# Patient Record
Sex: Female | Born: 1935 | Race: White | Hispanic: No
Health system: Southern US, Community
[De-identification: ages and names within clinical notes are randomized; demographics above are authoritative.]

---

## 2011-06-14 DIAGNOSIS — Z87898 Personal history of other specified conditions: Secondary | ICD-10-CM | POA: Diagnosis not present

## 2011-06-14 DIAGNOSIS — Z853 Personal history of malignant neoplasm of breast: Secondary | ICD-10-CM | POA: Diagnosis not present

## 2011-06-20 DIAGNOSIS — I714 Abdominal aortic aneurysm, without rupture: Secondary | ICD-10-CM | POA: Diagnosis not present

## 2011-07-22 DIAGNOSIS — G43809 Other migraine, not intractable, without status migrainosus: Secondary | ICD-10-CM | POA: Diagnosis not present

## 2011-12-17 DIAGNOSIS — Z1231 Encounter for screening mammogram for malignant neoplasm of breast: Secondary | ICD-10-CM | POA: Diagnosis not present

## 2011-12-19 DIAGNOSIS — M949 Disorder of cartilage, unspecified: Secondary | ICD-10-CM | POA: Diagnosis not present

## 2011-12-19 DIAGNOSIS — C50119 Malignant neoplasm of central portion of unspecified female breast: Secondary | ICD-10-CM | POA: Diagnosis not present

## 2011-12-19 DIAGNOSIS — M899 Disorder of bone, unspecified: Secondary | ICD-10-CM | POA: Diagnosis not present

## 2012-01-02 DIAGNOSIS — Z961 Presence of intraocular lens: Secondary | ICD-10-CM | POA: Diagnosis not present

## 2012-01-02 DIAGNOSIS — H43399 Other vitreous opacities, unspecified eye: Secondary | ICD-10-CM | POA: Diagnosis not present

## 2012-02-28 DIAGNOSIS — C50119 Malignant neoplasm of central portion of unspecified female breast: Secondary | ICD-10-CM | POA: Diagnosis not present

## 2012-02-28 DIAGNOSIS — M899 Disorder of bone, unspecified: Secondary | ICD-10-CM | POA: Diagnosis not present

## 2012-02-28 DIAGNOSIS — M949 Disorder of cartilage, unspecified: Secondary | ICD-10-CM | POA: Diagnosis not present

## 2012-06-18 DIAGNOSIS — Z09 Encounter for follow-up examination after completed treatment for conditions other than malignant neoplasm: Secondary | ICD-10-CM | POA: Diagnosis not present

## 2012-06-18 DIAGNOSIS — M899 Disorder of bone, unspecified: Secondary | ICD-10-CM | POA: Diagnosis not present

## 2012-06-18 DIAGNOSIS — Z9119 Patient's noncompliance with other medical treatment and regimen: Secondary | ICD-10-CM | POA: Diagnosis not present

## 2012-06-18 DIAGNOSIS — M949 Disorder of cartilage, unspecified: Secondary | ICD-10-CM | POA: Diagnosis not present

## 2012-06-18 DIAGNOSIS — Z853 Personal history of malignant neoplasm of breast: Secondary | ICD-10-CM | POA: Diagnosis not present

## 2012-09-16 DIAGNOSIS — G43809 Other migraine, not intractable, without status migrainosus: Secondary | ICD-10-CM | POA: Diagnosis not present

## 2012-09-25 DIAGNOSIS — L02219 Cutaneous abscess of trunk, unspecified: Secondary | ICD-10-CM | POA: Diagnosis not present

## 2012-09-25 DIAGNOSIS — R5381 Other malaise: Secondary | ICD-10-CM | POA: Diagnosis not present

## 2012-09-25 DIAGNOSIS — A938 Other specified arthropod-borne viral fevers: Secondary | ICD-10-CM | POA: Diagnosis not present

## 2012-09-25 DIAGNOSIS — L03319 Cellulitis of trunk, unspecified: Secondary | ICD-10-CM | POA: Diagnosis not present

## 2012-12-17 DIAGNOSIS — Z1231 Encounter for screening mammogram for malignant neoplasm of breast: Secondary | ICD-10-CM | POA: Diagnosis not present

## 2012-12-17 DIAGNOSIS — Z853 Personal history of malignant neoplasm of breast: Secondary | ICD-10-CM | POA: Diagnosis not present

## 2012-12-19 DIAGNOSIS — H81399 Other peripheral vertigo, unspecified ear: Secondary | ICD-10-CM | POA: Diagnosis not present

## 2012-12-24 DIAGNOSIS — Z09 Encounter for follow-up examination after completed treatment for conditions other than malignant neoplasm: Secondary | ICD-10-CM | POA: Diagnosis not present

## 2012-12-24 DIAGNOSIS — C50119 Malignant neoplasm of central portion of unspecified female breast: Secondary | ICD-10-CM | POA: Diagnosis not present

## 2012-12-24 DIAGNOSIS — M899 Disorder of bone, unspecified: Secondary | ICD-10-CM | POA: Diagnosis not present

## 2013-02-18 DIAGNOSIS — H6120 Impacted cerumen, unspecified ear: Secondary | ICD-10-CM | POA: Diagnosis not present

## 2013-02-18 DIAGNOSIS — Z136 Encounter for screening for cardiovascular disorders: Secondary | ICD-10-CM | POA: Diagnosis not present

## 2013-02-18 DIAGNOSIS — Z0000 Encounter for general adult medical examination without abnormal findings: Secondary | ICD-10-CM | POA: Diagnosis not present

## 2013-03-09 DIAGNOSIS — H5200 Hypermetropia, unspecified eye: Secondary | ICD-10-CM | POA: Diagnosis not present

## 2013-03-09 DIAGNOSIS — H52229 Regular astigmatism, unspecified eye: Secondary | ICD-10-CM | POA: Diagnosis not present

## 2013-03-09 DIAGNOSIS — Z9849 Cataract extraction status, unspecified eye: Secondary | ICD-10-CM | POA: Diagnosis not present

## 2013-03-09 DIAGNOSIS — Z961 Presence of intraocular lens: Secondary | ICD-10-CM | POA: Diagnosis not present

## 2013-03-16 DIAGNOSIS — Z1211 Encounter for screening for malignant neoplasm of colon: Secondary | ICD-10-CM | POA: Diagnosis not present

## 2013-07-28 DIAGNOSIS — R5381 Other malaise: Secondary | ICD-10-CM | POA: Diagnosis not present

## 2013-07-28 DIAGNOSIS — I959 Hypotension, unspecified: Secondary | ICD-10-CM | POA: Diagnosis not present

## 2013-07-28 DIAGNOSIS — R5383 Other fatigue: Secondary | ICD-10-CM | POA: Diagnosis not present

## 2013-08-05 DIAGNOSIS — R5383 Other fatigue: Secondary | ICD-10-CM | POA: Diagnosis not present

## 2013-08-05 DIAGNOSIS — R5381 Other malaise: Secondary | ICD-10-CM | POA: Diagnosis not present

## 2013-08-05 DIAGNOSIS — I959 Hypotension, unspecified: Secondary | ICD-10-CM | POA: Diagnosis not present

## 2013-12-24 DIAGNOSIS — Z1231 Encounter for screening mammogram for malignant neoplasm of breast: Secondary | ICD-10-CM | POA: Diagnosis not present

## 2013-12-30 DIAGNOSIS — Z853 Personal history of malignant neoplasm of breast: Secondary | ICD-10-CM | POA: Diagnosis not present

## 2013-12-30 DIAGNOSIS — M8589 Other specified disorders of bone density and structure, multiple sites: Secondary | ICD-10-CM | POA: Diagnosis not present

## 2014-01-14 DIAGNOSIS — H6123 Impacted cerumen, bilateral: Secondary | ICD-10-CM | POA: Diagnosis not present

## 2014-03-10 DIAGNOSIS — H43313 Vitreous membranes and strands, bilateral: Secondary | ICD-10-CM | POA: Diagnosis not present

## 2014-03-10 DIAGNOSIS — H5203 Hypermetropia, bilateral: Secondary | ICD-10-CM | POA: Diagnosis not present

## 2014-03-10 DIAGNOSIS — H43813 Vitreous degeneration, bilateral: Secondary | ICD-10-CM | POA: Diagnosis not present

## 2014-03-10 DIAGNOSIS — H524 Presbyopia: Secondary | ICD-10-CM | POA: Diagnosis not present

## 2014-03-10 DIAGNOSIS — H52223 Regular astigmatism, bilateral: Secondary | ICD-10-CM | POA: Diagnosis not present

## 2014-03-10 DIAGNOSIS — H3540 Unspecified peripheral retinal degeneration: Secondary | ICD-10-CM | POA: Diagnosis not present

## 2014-05-11 DIAGNOSIS — M25522 Pain in left elbow: Secondary | ICD-10-CM | POA: Diagnosis not present

## 2014-05-12 DIAGNOSIS — S52122A Displaced fracture of head of left radius, initial encounter for closed fracture: Secondary | ICD-10-CM | POA: Diagnosis not present

## 2014-05-26 DIAGNOSIS — S52122D Displaced fracture of head of left radius, subsequent encounter for closed fracture with routine healing: Secondary | ICD-10-CM | POA: Diagnosis not present

## 2014-07-11 DIAGNOSIS — S52122D Displaced fracture of head of left radius, subsequent encounter for closed fracture with routine healing: Secondary | ICD-10-CM | POA: Diagnosis not present

## 2014-08-22 DIAGNOSIS — L568 Other specified acute skin changes due to ultraviolet radiation: Secondary | ICD-10-CM | POA: Diagnosis not present

## 2014-08-22 DIAGNOSIS — Z6826 Body mass index (BMI) 26.0-26.9, adult: Secondary | ICD-10-CM | POA: Diagnosis not present

## 2014-12-29 DIAGNOSIS — Z1231 Encounter for screening mammogram for malignant neoplasm of breast: Secondary | ICD-10-CM | POA: Diagnosis not present

## 2015-01-02 DIAGNOSIS — Z853 Personal history of malignant neoplasm of breast: Secondary | ICD-10-CM | POA: Diagnosis not present

## 2015-01-09 DIAGNOSIS — N6489 Other specified disorders of breast: Secondary | ICD-10-CM | POA: Diagnosis not present

## 2015-01-09 DIAGNOSIS — R928 Other abnormal and inconclusive findings on diagnostic imaging of breast: Secondary | ICD-10-CM | POA: Diagnosis not present

## 2015-01-13 DIAGNOSIS — N63 Unspecified lump in breast: Secondary | ICD-10-CM | POA: Diagnosis not present

## 2015-01-13 DIAGNOSIS — Z853 Personal history of malignant neoplasm of breast: Secondary | ICD-10-CM | POA: Diagnosis not present

## 2015-01-16 ENCOUNTER — Other Ambulatory Visit: Payer: Self-pay | Admitting: Oncology

## 2015-01-16 DIAGNOSIS — R922 Inconclusive mammogram: Secondary | ICD-10-CM

## 2015-01-25 ENCOUNTER — Ambulatory Visit
Admission: RE | Admit: 2015-01-25 | Discharge: 2015-01-25 | Disposition: A | Discharge status: Home / Self Care | Payer: Medicare Other | Source: Ambulatory Visit | Attending: Oncology | Admitting: Oncology

## 2015-01-25 DIAGNOSIS — R922 Inconclusive mammogram: Secondary | ICD-10-CM

## 2015-01-25 DIAGNOSIS — N6012 Diffuse cystic mastopathy of left breast: Secondary | ICD-10-CM | POA: Diagnosis not present

## 2015-01-25 DIAGNOSIS — R928 Other abnormal and inconclusive findings on diagnostic imaging of breast: Secondary | ICD-10-CM | POA: Diagnosis not present

## 2015-01-25 DIAGNOSIS — R921 Mammographic calcification found on diagnostic imaging of breast: Secondary | ICD-10-CM | POA: Diagnosis not present

## 2015-02-06 DIAGNOSIS — Z853 Personal history of malignant neoplasm of breast: Secondary | ICD-10-CM | POA: Diagnosis not present

## 2015-02-06 DIAGNOSIS — N6489 Other specified disorders of breast: Secondary | ICD-10-CM | POA: Diagnosis not present

## 2015-02-20 DIAGNOSIS — N6489 Other specified disorders of breast: Secondary | ICD-10-CM | POA: Diagnosis not present

## 2015-03-22 ENCOUNTER — Other Ambulatory Visit: Payer: Self-pay

## 2015-03-22 DIAGNOSIS — H9190 Unspecified hearing loss, unspecified ear: Secondary | ICD-10-CM | POA: Diagnosis not present

## 2015-03-22 DIAGNOSIS — N6022 Fibroadenosis of left breast: Secondary | ICD-10-CM | POA: Diagnosis not present

## 2015-03-22 DIAGNOSIS — L905 Scar conditions and fibrosis of skin: Secondary | ICD-10-CM | POA: Diagnosis not present

## 2015-03-22 DIAGNOSIS — N6012 Diffuse cystic mastopathy of left breast: Secondary | ICD-10-CM | POA: Diagnosis not present

## 2015-03-22 DIAGNOSIS — N6092 Unspecified benign mammary dysplasia of left breast: Secondary | ICD-10-CM | POA: Diagnosis not present

## 2015-03-22 DIAGNOSIS — R928 Other abnormal and inconclusive findings on diagnostic imaging of breast: Secondary | ICD-10-CM | POA: Diagnosis not present

## 2015-03-22 DIAGNOSIS — Z853 Personal history of malignant neoplasm of breast: Secondary | ICD-10-CM | POA: Diagnosis not present

## 2015-03-22 DIAGNOSIS — N6489 Other specified disorders of breast: Secondary | ICD-10-CM | POA: Diagnosis not present

## 2015-06-08 DIAGNOSIS — H43393 Other vitreous opacities, bilateral: Secondary | ICD-10-CM | POA: Diagnosis not present

## 2015-06-08 DIAGNOSIS — Z9841 Cataract extraction status, right eye: Secondary | ICD-10-CM | POA: Diagnosis not present

## 2015-06-08 DIAGNOSIS — H524 Presbyopia: Secondary | ICD-10-CM | POA: Diagnosis not present

## 2015-06-08 DIAGNOSIS — H52223 Regular astigmatism, bilateral: Secondary | ICD-10-CM | POA: Diagnosis not present

## 2015-06-08 DIAGNOSIS — H43813 Vitreous degeneration, bilateral: Secondary | ICD-10-CM | POA: Diagnosis not present

## 2015-06-08 DIAGNOSIS — Z961 Presence of intraocular lens: Secondary | ICD-10-CM | POA: Diagnosis not present

## 2015-06-08 DIAGNOSIS — Z9842 Cataract extraction status, left eye: Secondary | ICD-10-CM | POA: Diagnosis not present

## 2015-06-08 DIAGNOSIS — H35373 Puckering of macula, bilateral: Secondary | ICD-10-CM | POA: Diagnosis not present

## 2015-07-06 DIAGNOSIS — Z853 Personal history of malignant neoplasm of breast: Secondary | ICD-10-CM | POA: Diagnosis not present

## 2016-02-08 DIAGNOSIS — R928 Other abnormal and inconclusive findings on diagnostic imaging of breast: Secondary | ICD-10-CM | POA: Diagnosis not present

## 2016-02-15 DIAGNOSIS — Z853 Personal history of malignant neoplasm of breast: Secondary | ICD-10-CM | POA: Diagnosis not present

## 2016-02-15 DIAGNOSIS — L659 Nonscarring hair loss, unspecified: Secondary | ICD-10-CM | POA: Diagnosis not present

## 2017-01-21 IMAGING — MG MM BREAST BX W LOC DEV 1ST LESION IMAGE BX SPEC STEREO GUIDE*L*
4 series · 4 of 12 positions shown · non-contrast
Comparison: Previous exams.

ADDENDUM:
Pathology reveals complex sclerosing lesion/radial scar with
associated fibrocystic changes of the Left lateral breast at the 3
o'clock location with excision recommended. This was found to be
concordant by Dr. Iqtidar Asrat. Pathology results were discussed
with the patient via telephone. She reported tenderness at the
biopsy site and is doing well otherwise. Post biopsy care and
instructions were reviewed and questions were answered. She was
encouraged to call The [REDACTED] with any
additional questions and or concerns. A surgical referral is being
arranged by Dr. Martin Ricardo Venegas in [HOSPITAL][HOSPITAL]. The patient has
an appointment with Dr. Jarinah on February 06, 2015.

Pathology results reported by Fralalan Ontl RN on January 26, 2015.
CLINICAL DATA: Patient presents for stereotactic core needle biopsy
of an area of focal density in the lateral left breast.
EXAM:
LEFT BREAST STEREOTACTIC CORE NEEDLE BIOPSY

[L CC (1 of 2)]
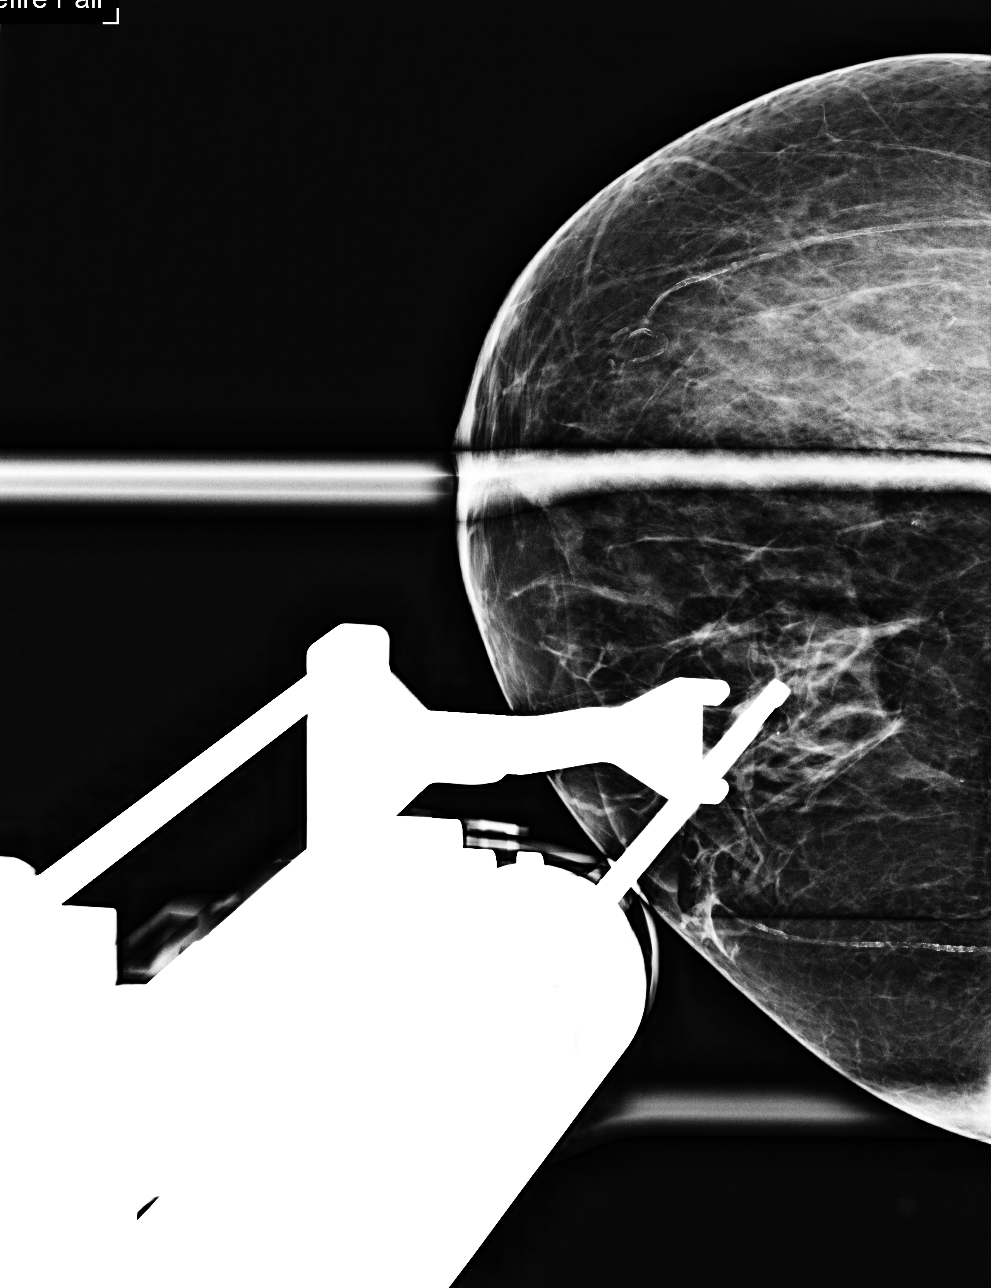

[L CC (2 of 2)]
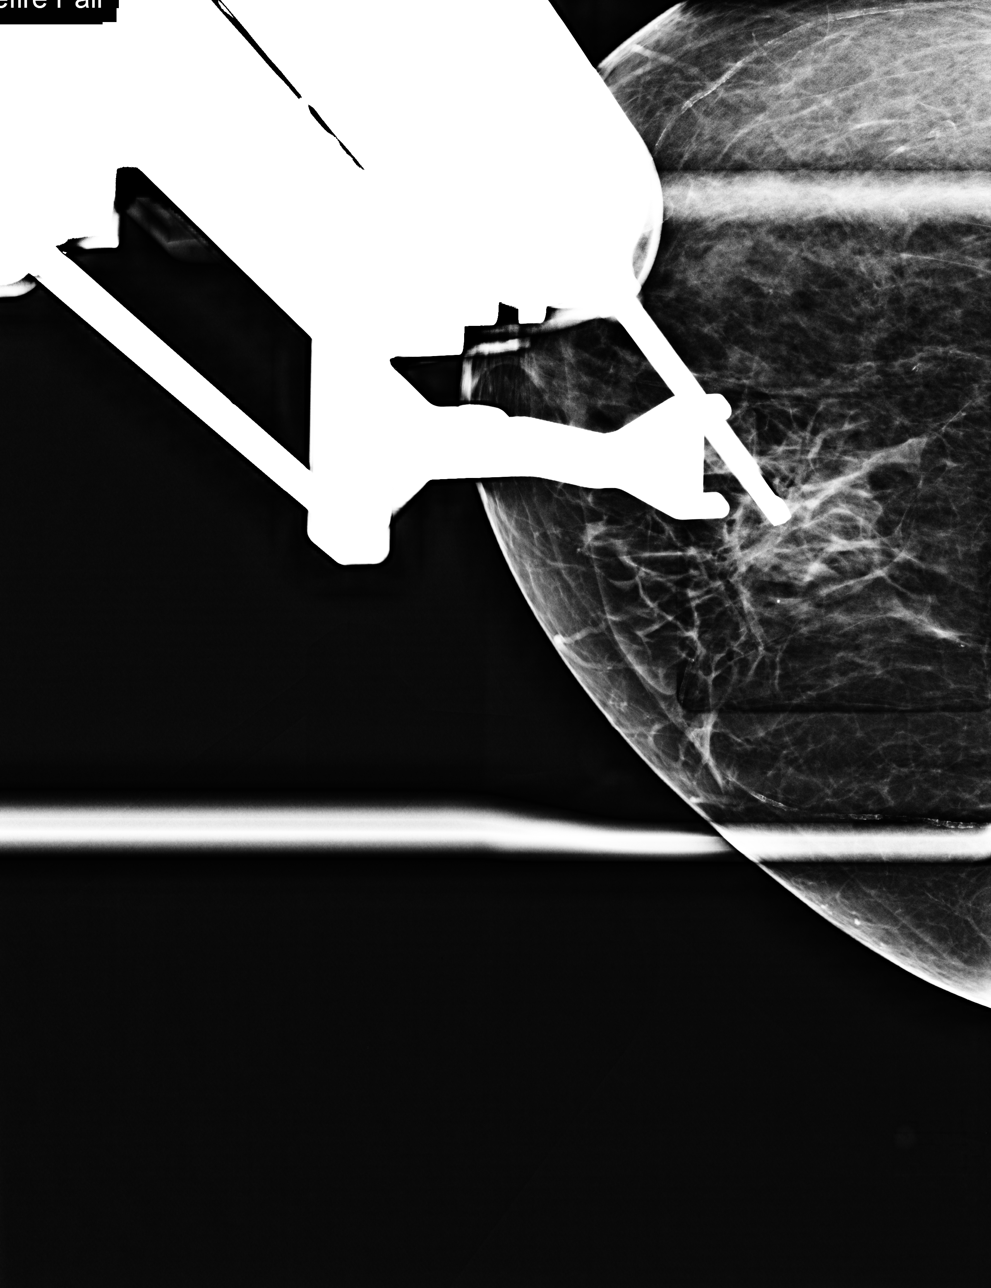

[L CC tomo (1 of 2) · tomo slice 23/44.0]
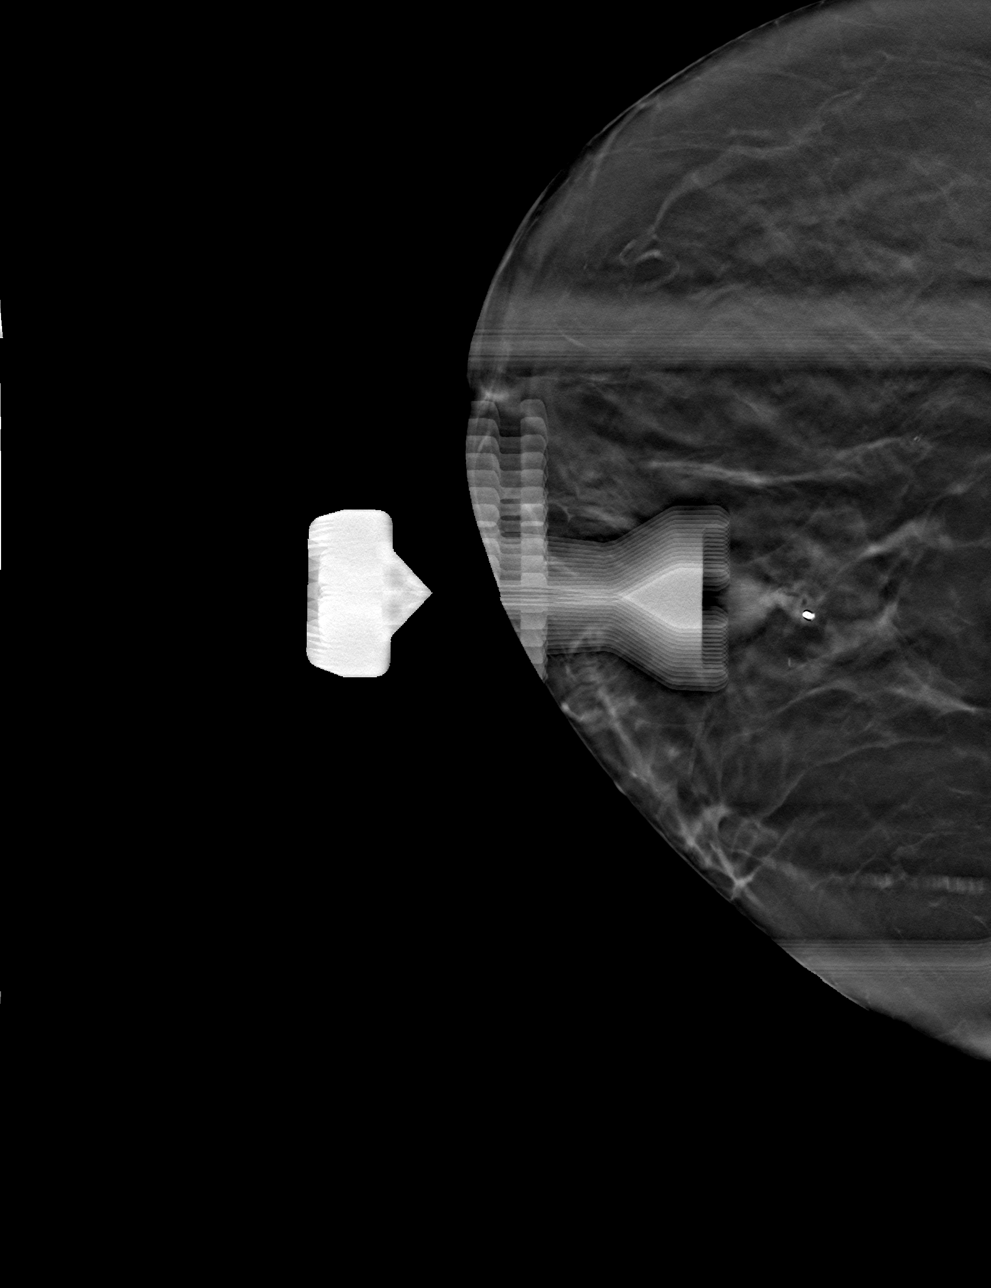

[L CC tomo (2 of 2) · tomo slice 23/45.0]
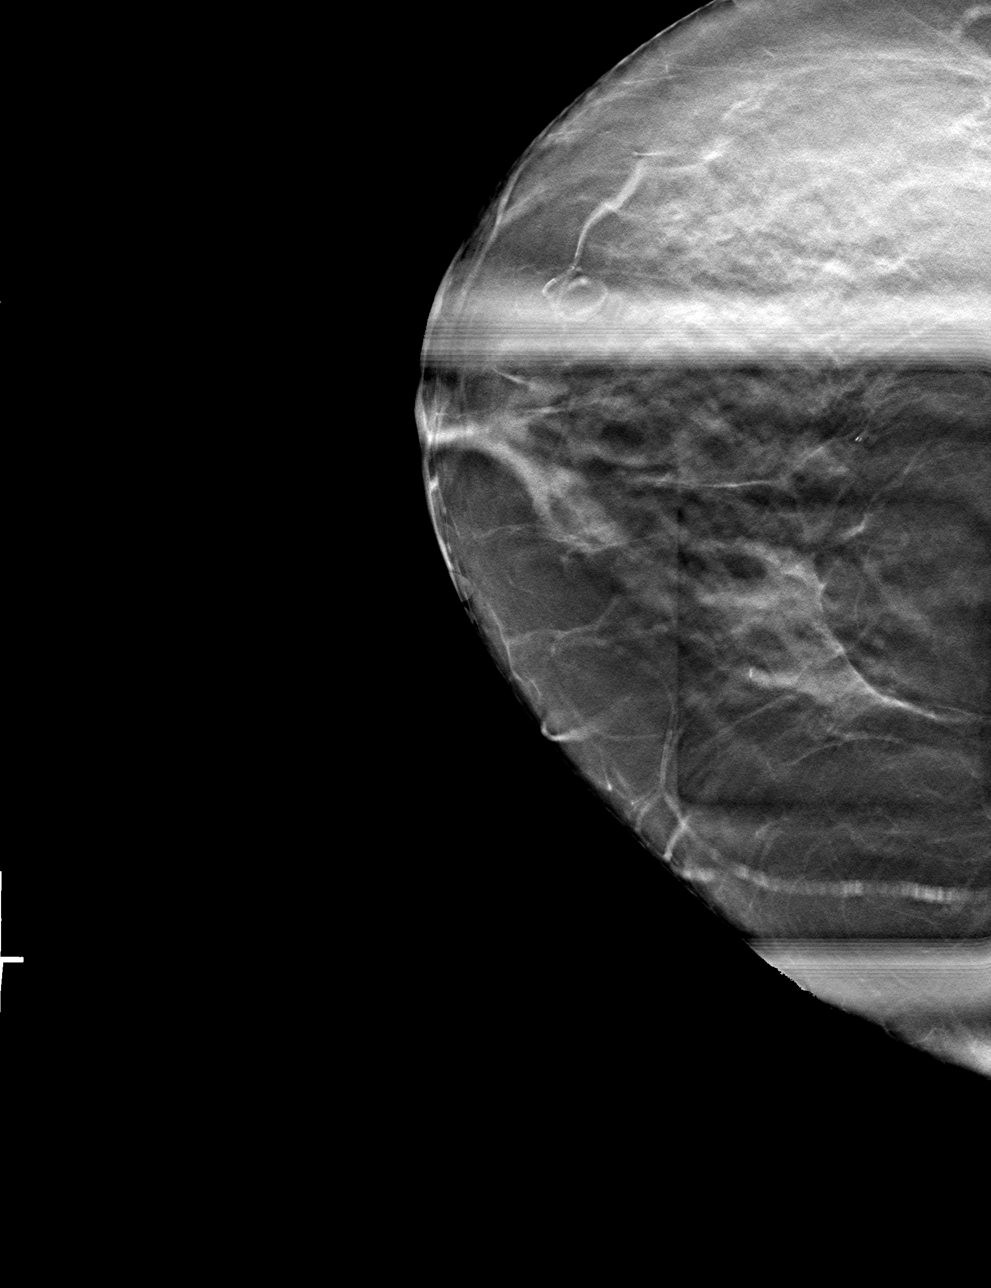

[4 of 12 positions shown; findings below may reference images not displayed]



Using sterile technique and the as local anesthetic, under
stereotactic guidance, a 9 gauge vacuum assist device was used to
perform core needle biopsy of her focal density in the lateral left
breast using a cranial to caudal approach.

At the conclusion of the procedure, a coil shaped tissue marker clip
was deployed into the biopsy cavity. Follow-up 2-view mammogram was
performed and dictated separately.
IMPRESSION: Stereotactic-guided biopsy of an area of focal opacity in the left
breast. No apparent complications.

## 2017-01-21 IMAGING — MG MM DIAG BREAST TOMO UNI LEFT
4 series · 4 of 12 positions shown · non-contrast
Comparison: Previous exam(s).

CLINICAL DATA: Status post stereotactic core needle biopsy of an
area of focal opacity in the left breast.

EXAM:
3D DIAGNOSTIC LEFT MAMMOGRAM POST STEREOTACTIC BIOPSY

[L ML]
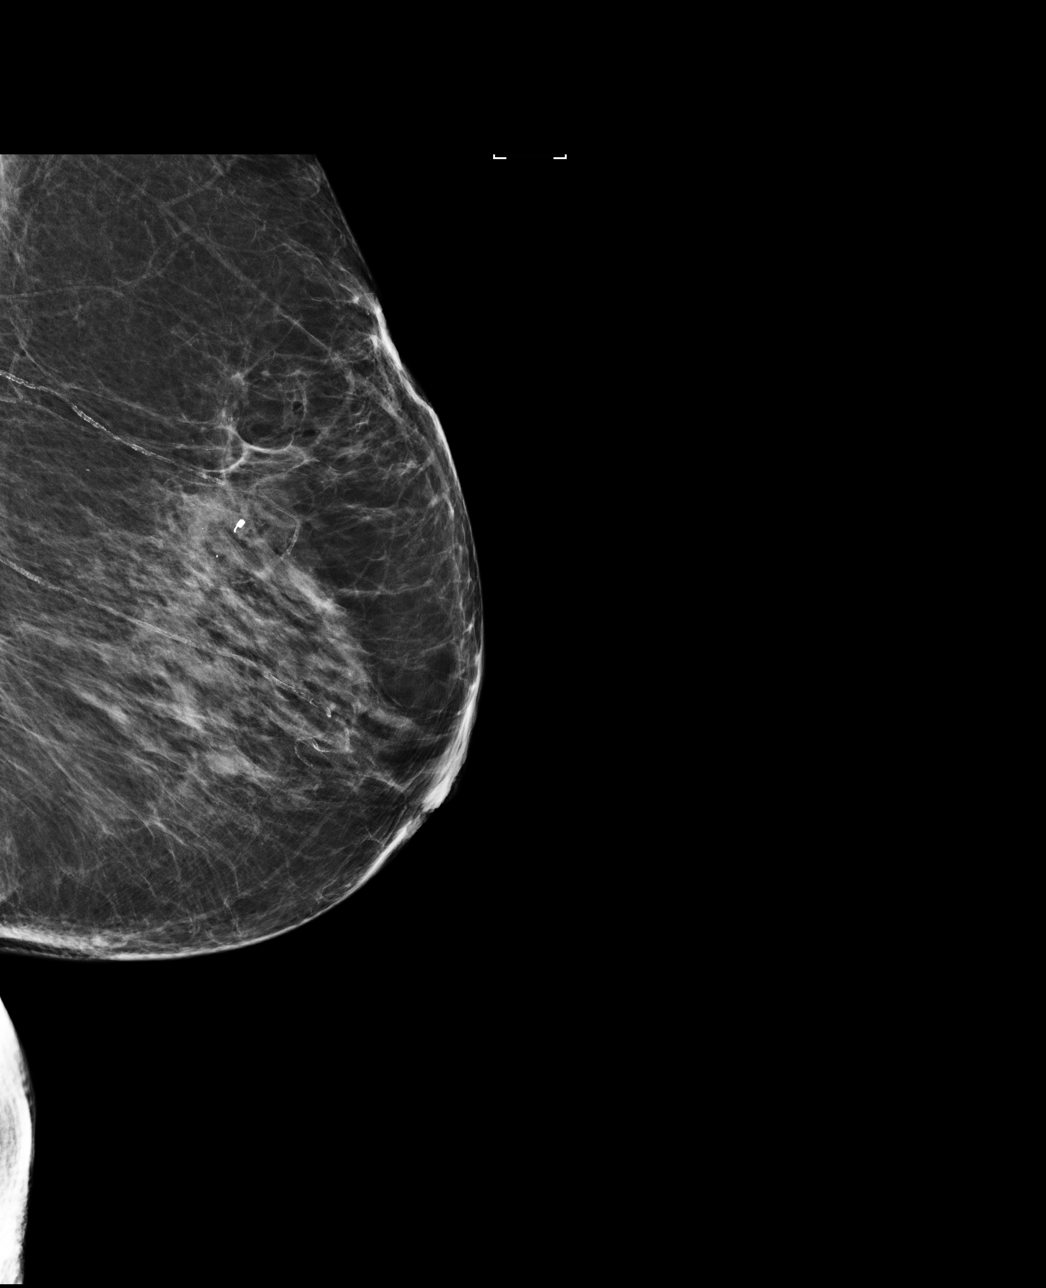

[L CC]
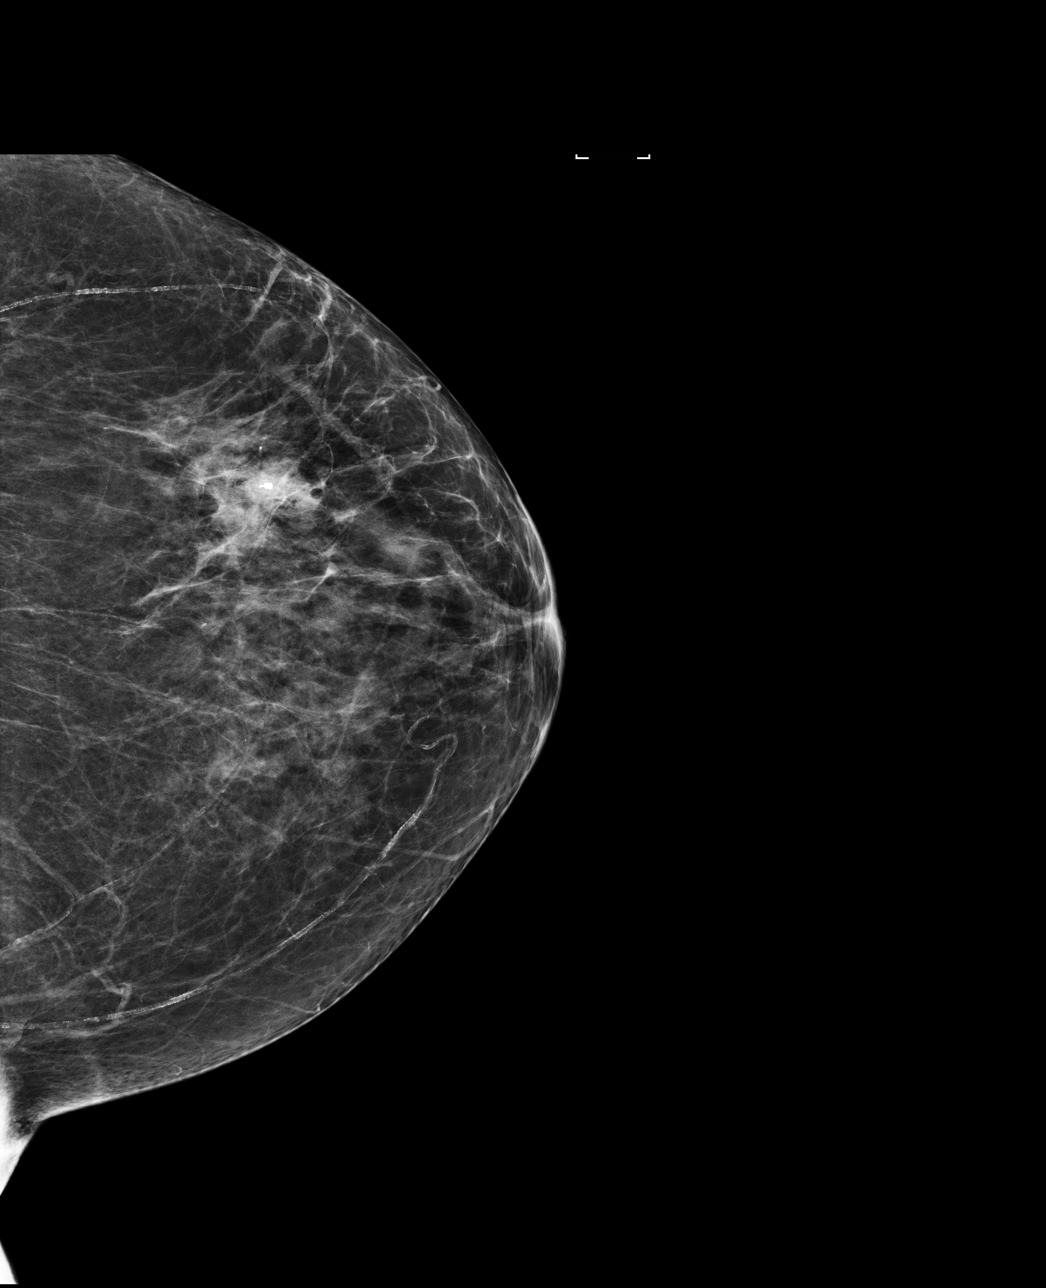

[L ML tomo · tomo slice 36/71.0]
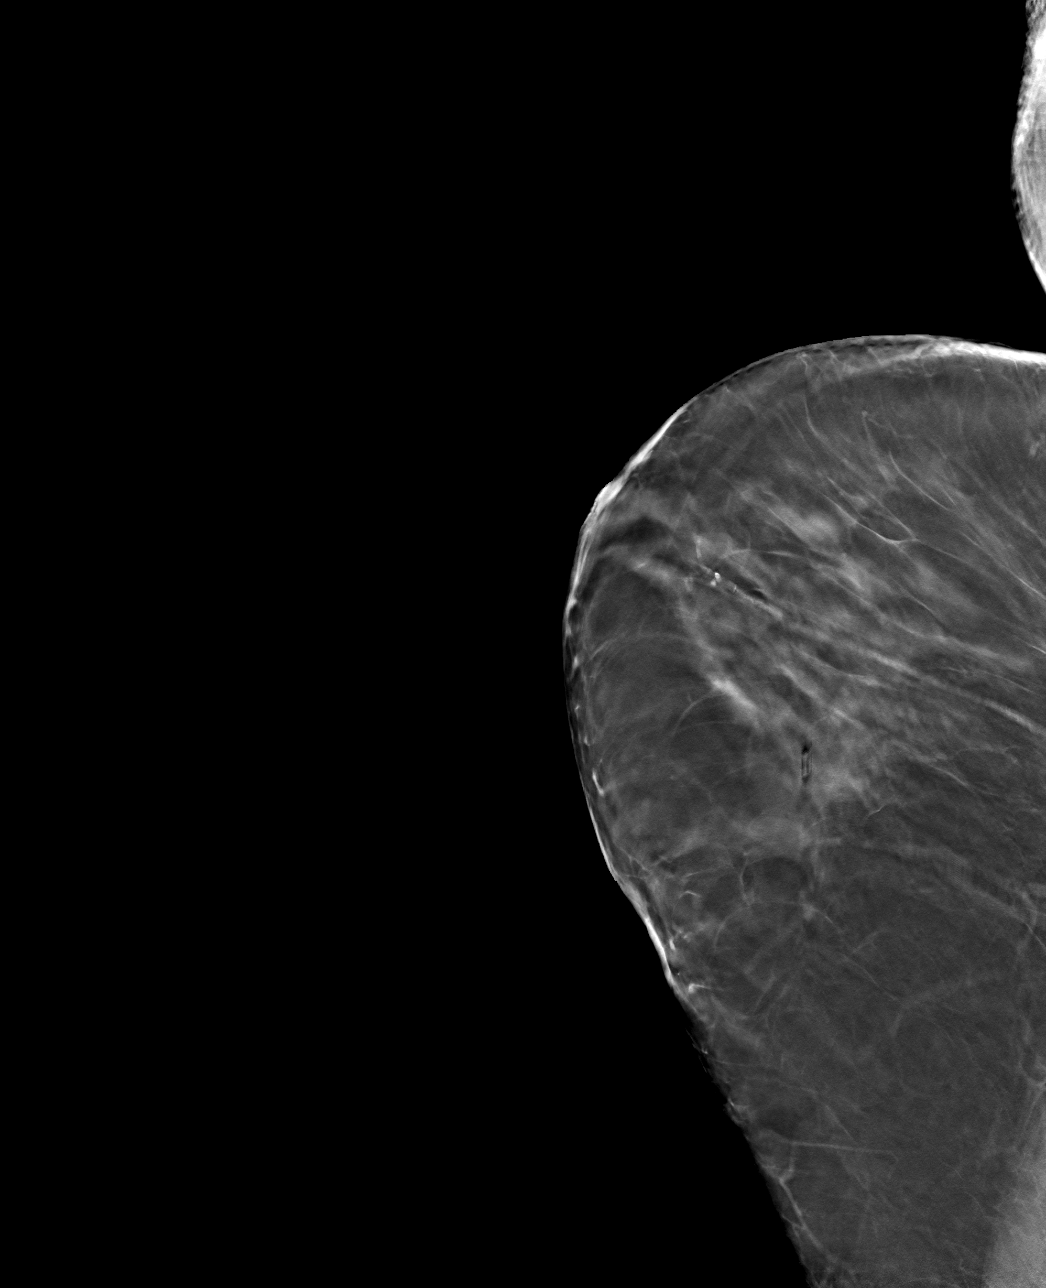

[L CC tomo · tomo slice 32/63.0]
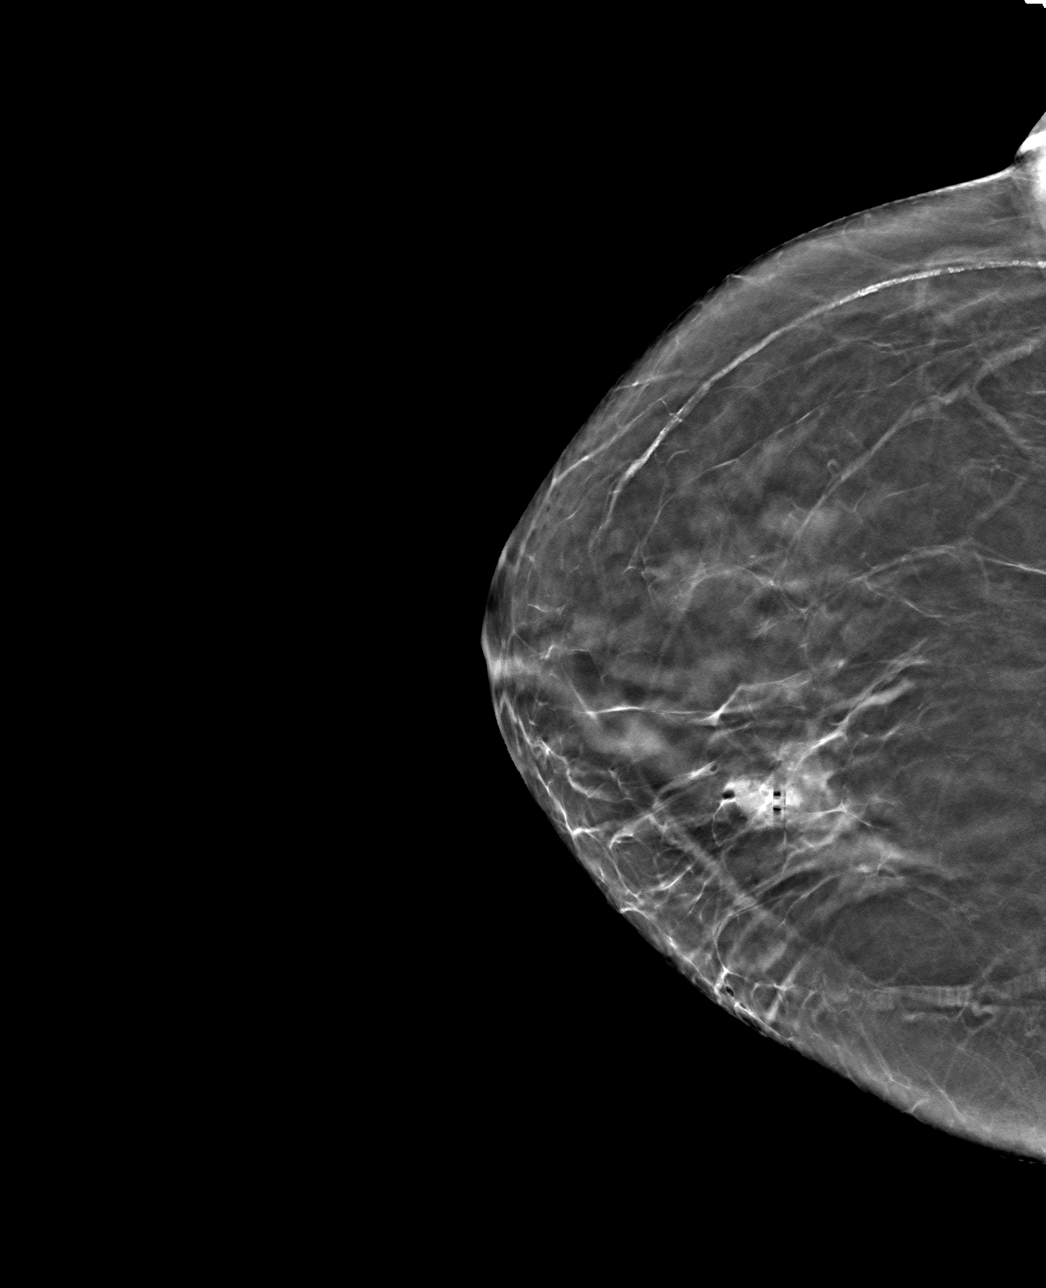

[4 of 12 positions shown; findings below may reference images not displayed]

FINDINGS: 3D Mammographic images were obtained following stereotactic guided
biopsy of an area of focal opacity in the lateral left breast. The
coil shaped biopsy clip lies within the area of focal opacity.
IMPRESSION: Well-positioned coil shaped biopsy clip following stereotactic core
needle biopsy of the left breast.

Final Assessment: Post Procedure Mammograms for Marker Placement

## 2017-07-17 DIAGNOSIS — F0390 Unspecified dementia without behavioral disturbance: Secondary | ICD-10-CM | POA: Diagnosis not present

## 2017-07-17 DIAGNOSIS — Z853 Personal history of malignant neoplasm of breast: Secondary | ICD-10-CM | POA: Diagnosis not present

## 2017-07-17 DIAGNOSIS — E559 Vitamin D deficiency, unspecified: Secondary | ICD-10-CM | POA: Diagnosis not present

## 2017-07-21 ENCOUNTER — Encounter: Payer: Self-pay | Admitting: Neurology

## 2017-10-08 ENCOUNTER — Ambulatory Visit: Payer: Medicare Other | Admitting: Neurology

## 2017-12-11 DIAGNOSIS — I10 Essential (primary) hypertension: Secondary | ICD-10-CM | POA: Diagnosis not present

## 2017-12-11 DIAGNOSIS — I249 Acute ischemic heart disease, unspecified: Secondary | ICD-10-CM | POA: Diagnosis not present

## 2017-12-11 DIAGNOSIS — Z853 Personal history of malignant neoplasm of breast: Secondary | ICD-10-CM | POA: Diagnosis not present

## 2017-12-11 DIAGNOSIS — R0789 Other chest pain: Secondary | ICD-10-CM | POA: Diagnosis not present

## 2017-12-11 DIAGNOSIS — I7409 Other arterial embolism and thrombosis of abdominal aorta: Secondary | ICD-10-CM | POA: Diagnosis not present

## 2017-12-11 DIAGNOSIS — R079 Chest pain, unspecified: Secondary | ICD-10-CM | POA: Diagnosis not present

## 2017-12-11 DIAGNOSIS — R7989 Other specified abnormal findings of blood chemistry: Secondary | ICD-10-CM | POA: Diagnosis not present

## 2017-12-11 DIAGNOSIS — J984 Other disorders of lung: Secondary | ICD-10-CM | POA: Diagnosis not present

## 2017-12-11 DIAGNOSIS — R410 Disorientation, unspecified: Secondary | ICD-10-CM | POA: Diagnosis not present

## 2017-12-12 DIAGNOSIS — R600 Localized edema: Secondary | ICD-10-CM | POA: Diagnosis not present

## 2017-12-12 DIAGNOSIS — R7889 Finding of other specified substances, not normally found in blood: Secondary | ICD-10-CM | POA: Diagnosis not present

## 2017-12-12 DIAGNOSIS — R079 Chest pain, unspecified: Secondary | ICD-10-CM | POA: Diagnosis not present

## 2018-01-19 DIAGNOSIS — H5212 Myopia, left eye: Secondary | ICD-10-CM | POA: Diagnosis not present

## 2018-03-19 DIAGNOSIS — Z1231 Encounter for screening mammogram for malignant neoplasm of breast: Secondary | ICD-10-CM | POA: Diagnosis not present

## 2018-03-26 DIAGNOSIS — Z853 Personal history of malignant neoplasm of breast: Secondary | ICD-10-CM | POA: Diagnosis not present

## 2018-03-26 DIAGNOSIS — E559 Vitamin D deficiency, unspecified: Secondary | ICD-10-CM | POA: Diagnosis not present

## 2018-03-26 DIAGNOSIS — F0390 Unspecified dementia without behavioral disturbance: Secondary | ICD-10-CM | POA: Diagnosis not present

## 2018-09-24 DIAGNOSIS — M899 Disorder of bone, unspecified: Secondary | ICD-10-CM | POA: Diagnosis not present

## 2018-09-24 DIAGNOSIS — Z5189 Encounter for other specified aftercare: Secondary | ICD-10-CM | POA: Diagnosis not present

## 2018-09-24 DIAGNOSIS — E559 Vitamin D deficiency, unspecified: Secondary | ICD-10-CM | POA: Diagnosis not present

## 2018-09-24 DIAGNOSIS — F0390 Unspecified dementia without behavioral disturbance: Secondary | ICD-10-CM | POA: Diagnosis not present

## 2018-09-24 DIAGNOSIS — C50119 Malignant neoplasm of central portion of unspecified female breast: Secondary | ICD-10-CM | POA: Diagnosis not present

## 2018-09-24 DIAGNOSIS — Z853 Personal history of malignant neoplasm of breast: Secondary | ICD-10-CM | POA: Diagnosis not present

## 2019-01-25 DIAGNOSIS — R531 Weakness: Secondary | ICD-10-CM | POA: Diagnosis not present

## 2019-01-25 DIAGNOSIS — R42 Dizziness and giddiness: Secondary | ICD-10-CM | POA: Diagnosis not present

## 2019-01-25 DIAGNOSIS — B9689 Other specified bacterial agents as the cause of diseases classified elsewhere: Secondary | ICD-10-CM | POA: Diagnosis not present

## 2019-01-25 DIAGNOSIS — R457 State of emotional shock and stress, unspecified: Secondary | ICD-10-CM | POA: Diagnosis not present

## 2019-01-25 DIAGNOSIS — N390 Urinary tract infection, site not specified: Secondary | ICD-10-CM | POA: Diagnosis not present

## 2019-02-01 DIAGNOSIS — Z20828 Contact with and (suspected) exposure to other viral communicable diseases: Secondary | ICD-10-CM | POA: Diagnosis not present

## 2019-02-01 DIAGNOSIS — Z87440 Personal history of urinary (tract) infections: Secondary | ICD-10-CM | POA: Diagnosis not present

## 2019-02-01 DIAGNOSIS — R079 Chest pain, unspecified: Secondary | ICD-10-CM | POA: Diagnosis not present

## 2019-02-01 DIAGNOSIS — R410 Disorientation, unspecified: Secondary | ICD-10-CM | POA: Diagnosis not present

## 2019-02-01 DIAGNOSIS — R42 Dizziness and giddiness: Secondary | ICD-10-CM | POA: Diagnosis not present

## 2019-02-01 DIAGNOSIS — Z66 Do not resuscitate: Secondary | ICD-10-CM | POA: Diagnosis not present

## 2019-02-01 DIAGNOSIS — J90 Pleural effusion, not elsewhere classified: Secondary | ICD-10-CM | POA: Diagnosis not present

## 2019-02-01 DIAGNOSIS — R531 Weakness: Secondary | ICD-10-CM | POA: Diagnosis not present

## 2019-02-01 DIAGNOSIS — R0789 Other chest pain: Secondary | ICD-10-CM | POA: Diagnosis not present

## 2019-02-01 DIAGNOSIS — Z853 Personal history of malignant neoplasm of breast: Secondary | ICD-10-CM | POA: Diagnosis not present

## 2019-02-01 DIAGNOSIS — R404 Transient alteration of awareness: Secondary | ICD-10-CM | POA: Diagnosis not present

## 2019-02-01 DIAGNOSIS — R0902 Hypoxemia: Secondary | ICD-10-CM | POA: Diagnosis not present

## 2019-02-01 DIAGNOSIS — I6782 Cerebral ischemia: Secondary | ICD-10-CM | POA: Diagnosis not present

## 2019-02-01 DIAGNOSIS — R0689 Other abnormalities of breathing: Secondary | ICD-10-CM | POA: Diagnosis not present

## 2019-03-22 DIAGNOSIS — Z1231 Encounter for screening mammogram for malignant neoplasm of breast: Secondary | ICD-10-CM | POA: Diagnosis not present

## 2019-05-07 ENCOUNTER — Ambulatory Visit: Payer: Medicare HMO | Attending: Internal Medicine

## 2019-05-07 DIAGNOSIS — Z23 Encounter for immunization: Secondary | ICD-10-CM | POA: Insufficient documentation

## 2019-05-07 NOTE — Progress Notes (Signed)
   Covid-19 Vaccination Clinic  Name:  Jasmine Russo    MRN: [id number] DOB: 1928/10/04  05/07/2019  [name] was observed post Covid-19 immunization for 15 minutes without incidence. She was provided with Vaccine Information Sheet and instruction to access the V-Safe system.   [name] was instructed to call 911 with any severe reactions post vaccine: Marland Kitchen Difficulty breathing  . Swelling of your face and throat  . A fast heartbeat  . A bad rash all over your body  . Dizziness and weakness    Immunizations Administered    Name Date Dose VIS Date Route   Pfizer COVID-19 Vaccine 05/07/2019  4:24 PM 0.3 mL 02/19/2019 Intramuscular   Manufacturer: [place]   Lot: [id number]   [place]: [id number]

## 2019-06-02 ENCOUNTER — Ambulatory Visit: Payer: Medicare HMO | Attending: Internal Medicine

## 2019-06-02 DIAGNOSIS — Z23 Encounter for immunization: Secondary | ICD-10-CM

## 2019-06-02 NOTE — Progress Notes (Signed)
   Covid-19 Vaccination Clinic  Name:  Berlie Masley    MRN: [id number] DOB: 08-30-1928  06/02/2019  Ms. Espeland was observed post Covid-19 immunization for 15 minutes without incident. She was provided with Vaccine Information Sheet and instruction to access the V-Safe system.   Ms. Segrist was instructed to call 911 with any severe reactions post vaccine: Marland Kitchen Difficulty breathing  . Swelling of face and throat  . A fast heartbeat  . A bad rash all over body  . Dizziness and weakness   Immunizations Administered    Name Date Dose VIS Date Route   Pfizer COVID-19 Vaccine 06/02/2019  1:22 PM 0.3 mL 02/19/2019 Intramuscular   Manufacturer: [place]   Lot: [id number]   [place]: [id number]

## 2020-04-26 DIAGNOSIS — D485 Neoplasm of uncertain behavior of skin: Secondary | ICD-10-CM | POA: Diagnosis not present

## 2020-05-01 DIAGNOSIS — C4442 Squamous cell carcinoma of skin of scalp and neck: Secondary | ICD-10-CM | POA: Diagnosis not present

## 2020-07-06 DIAGNOSIS — N859 Noninflammatory disorder of uterus, unspecified: Secondary | ICD-10-CM | POA: Diagnosis not present

## 2020-07-06 DIAGNOSIS — K529 Noninfective gastroenteritis and colitis, unspecified: Secondary | ICD-10-CM | POA: Diagnosis not present

## 2020-07-06 DIAGNOSIS — R55 Syncope and collapse: Secondary | ICD-10-CM | POA: Diagnosis not present

## 2020-07-06 DIAGNOSIS — D1809 Hemangioma of other sites: Secondary | ICD-10-CM | POA: Diagnosis not present

## 2020-07-06 DIAGNOSIS — K5730 Diverticulosis of large intestine without perforation or abscess without bleeding: Secondary | ICD-10-CM | POA: Diagnosis not present

## 2020-07-06 DIAGNOSIS — D1803 Hemangioma of intra-abdominal structures: Secondary | ICD-10-CM | POA: Diagnosis not present

## 2020-07-06 DIAGNOSIS — E278 Other specified disorders of adrenal gland: Secondary | ICD-10-CM | POA: Diagnosis not present

## 2020-07-06 DIAGNOSIS — I712 Thoracic aortic aneurysm, without rupture: Secondary | ICD-10-CM | POA: Diagnosis not present

## 2020-07-06 DIAGNOSIS — R600 Localized edema: Secondary | ICD-10-CM | POA: Diagnosis not present

## 2020-07-06 DIAGNOSIS — E279 Disorder of adrenal gland, unspecified: Secondary | ICD-10-CM | POA: Diagnosis not present

## 2021-02-08 DIAGNOSIS — F0390 Unspecified dementia without behavioral disturbance: Secondary | ICD-10-CM | POA: Diagnosis not present

## 2021-02-08 DIAGNOSIS — N390 Urinary tract infection, site not specified: Secondary | ICD-10-CM | POA: Diagnosis not present

## 2021-02-08 DIAGNOSIS — I9589 Other hypotension: Secondary | ICD-10-CM | POA: Diagnosis not present

## 2021-02-08 DIAGNOSIS — R197 Diarrhea, unspecified: Secondary | ICD-10-CM | POA: Diagnosis not present

## 2021-02-08 DIAGNOSIS — I4891 Unspecified atrial fibrillation: Secondary | ICD-10-CM | POA: Diagnosis not present

## 2021-02-08 DIAGNOSIS — R531 Weakness: Secondary | ICD-10-CM | POA: Diagnosis not present

## 2021-02-08 DIAGNOSIS — I959 Hypotension, unspecified: Secondary | ICD-10-CM | POA: Diagnosis not present

## 2021-02-08 DIAGNOSIS — R0902 Hypoxemia: Secondary | ICD-10-CM | POA: Diagnosis not present

## 2021-02-08 DIAGNOSIS — Z853 Personal history of malignant neoplasm of breast: Secondary | ICD-10-CM | POA: Diagnosis not present

## 2021-02-08 DIAGNOSIS — R55 Syncope and collapse: Secondary | ICD-10-CM | POA: Diagnosis not present

## 2021-03-13 DIAGNOSIS — Z23 Encounter for immunization: Secondary | ICD-10-CM | POA: Diagnosis not present
